# Patient Record
Sex: Male | Born: 1950 | Race: Black or African American | Hispanic: No
Health system: Southern US, Community
[De-identification: ages and names within clinical notes are randomized; demographics above are authoritative.]

## PROBLEM LIST (undated history)

## (undated) ENCOUNTER — Emergency Department (HOSPITAL_COMMUNITY): Admission: EM | Payer: No Typology Code available for payment source | Source: Home / Self Care

## (undated) DIAGNOSIS — I10 Essential (primary) hypertension: Secondary | ICD-10-CM

---

## 2017-06-18 ENCOUNTER — Other Ambulatory Visit: Payer: Self-pay

## 2017-06-18 ENCOUNTER — Emergency Department (HOSPITAL_COMMUNITY)
Admission: EM | Admit: 2017-06-18 | Discharge: 2017-06-18 | Disposition: A | Discharge status: Home / Self Care | Payer: Medicare Other | Attending: Emergency Medicine | Admitting: Emergency Medicine

## 2017-06-18 ENCOUNTER — Emergency Department (HOSPITAL_COMMUNITY): Payer: Medicare Other

## 2017-06-18 DIAGNOSIS — F10920 Alcohol use, unspecified with intoxication, uncomplicated: Secondary | ICD-10-CM | POA: Diagnosis not present

## 2017-06-18 DIAGNOSIS — Y999 Unspecified external cause status: Secondary | ICD-10-CM | POA: Insufficient documentation

## 2017-06-18 DIAGNOSIS — W19XXXA Unspecified fall, initial encounter: Secondary | ICD-10-CM

## 2017-06-18 DIAGNOSIS — Y9301 Activity, walking, marching and hiking: Secondary | ICD-10-CM | POA: Diagnosis not present

## 2017-06-18 DIAGNOSIS — Y929 Unspecified place or not applicable: Secondary | ICD-10-CM | POA: Insufficient documentation

## 2017-06-18 DIAGNOSIS — R55 Syncope and collapse: Secondary | ICD-10-CM | POA: Diagnosis not present

## 2017-06-18 DIAGNOSIS — S0990XA Unspecified injury of head, initial encounter: Secondary | ICD-10-CM | POA: Diagnosis not present

## 2017-06-18 DIAGNOSIS — W1830XA Fall on same level, unspecified, initial encounter: Secondary | ICD-10-CM | POA: Insufficient documentation

## 2017-06-18 LAB — CBC
HEMATOCRIT: 39.5 % (ref 39.0–52.0)
HEMOGLOBIN: 12.8 g/dL — AB (ref 13.0–17.0)
MCH: 30 pg (ref 26.0–34.0)
MCHC: 32.4 g/dL (ref 30.0–36.0)
MCV: 92.5 fL (ref 78.0–100.0)
Platelets: 267 10*3/uL (ref 150–400)
RBC: 4.27 MIL/uL (ref 4.22–5.81)
RDW: 13.4 % (ref 11.5–15.5)
WBC: 6.8 10*3/uL (ref 4.0–10.5)

## 2017-06-18 LAB — COMPREHENSIVE METABOLIC PANEL
ALK PHOS: 56 U/L (ref 38–126)
ALT: 16 U/L — ABNORMAL LOW (ref 17–63)
ANION GAP: 10 (ref 5–15)
AST: 18 U/L (ref 15–41)
Albumin: 4.1 g/dL (ref 3.5–5.0)
BILIRUBIN TOTAL: 1 mg/dL (ref 0.3–1.2)
BUN: 6 mg/dL (ref 6–20)
CALCIUM: 9.4 mg/dL (ref 8.9–10.3)
CO2: 23 mmol/L (ref 22–32)
Chloride: 107 mmol/L (ref 101–111)
Creatinine, Ser: 0.86 mg/dL (ref 0.61–1.24)
GFR calc non Af Amer: >60 mL/min (ref >60)
Glucose, Bld: 102 mg/dL — ABNORMAL HIGH (ref 65–99)
POTASSIUM: 3.3 mmol/L — AB (ref 3.5–5.1)
SODIUM: 140 mmol/L (ref 135–145)
TOTAL PROTEIN: 7.4 g/dL (ref 6.5–8.1)

## 2017-06-18 LAB — ETHANOL: Alcohol, Ethyl (B): 248 mg/dL — ABNORMAL HIGH (ref <10)

## 2017-06-18 NOTE — ED Notes (Signed)
Pt became hateful and disrespectful to staff. Provider notified and patient was escorted out of ED
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
## 2017-06-18 NOTE — ED Notes (Signed)
Patient transported to CT 

## 2017-06-18 NOTE — ED Notes (Signed)
Patient returned from CT

## 2017-06-18 NOTE — ED Notes (Signed)
Pt became hateful to staff in hallway. Provider notified and security called. Pt being escorted out of ED

## 2017-06-18 NOTE — ED Provider Notes (Signed)
[facility] EMERGENCY DEPARTMENT Provider Note   CSN: [id number] Arrival date & time: 06/18/17  9562     History   Chief Complaint Chief Complaint  Patient presents with  . Loss of Consciousness    HPI Tyler Oconnor is a 66 y.o. male.  Patient with history of alcoholism presents emergency department after reported fall.  Patient has been drinking alcohol heavily.  Patient may have hit his head.  Level 5 caveat due to intoxication.      No past medical history on file.  There are no active problems to display for this patient.   The histories are not reviewed yet. Please review them in the "History" navigator section and refresh this SmartLink.     Home Medications    Prior to Admission medications   Not on File    Family History No family history on file.  Social History Social History   Tobacco Use  . Smoking status: Not on file  Substance Use Topics  . Alcohol use: Not on file  . Drug use: Not on file     Allergies   Patient has no allergy information on record.   Review of Systems Review of Systems  Unable to perform ROS: Mental status change     Physical Exam Updated Vital Signs BP 133/63 (BP Location: Right Arm)   Pulse 69   Temp 97.7 F (36.5 C) (Oral)   Resp 17   Ht 5\' 6"  (1.676 m)   Wt 81.6 kg (180 lb)   SpO2 94%   BMI 29.05 kg/m   Physical Exam  Constitutional: He appears well-developed and well-nourished.  HENT:  Head: Normocephalic and atraumatic.  Mouth/Throat: Oropharynx is clear and moist.  No large contusions or depressions of the scalp or skull.  Eyes: Conjunctivae are normal. Right eye exhibits no discharge. Left eye exhibits no discharge.  Neck: Normal range of motion. Neck supple.  Cardiovascular: Normal rate, regular rhythm and normal heart sounds.  Pulmonary/Chest: Effort normal and breath sounds normal.  Abdominal: Soft. There is no tenderness. There is no rebound and no guarding.    Musculoskeletal:  No obvious extremity trauma.  Neurological: He is alert.  Patient answers questions but has slurred speech.  Skin: Skin is warm and dry.  Psychiatric: He has a normal mood and affect.  Nursing note and vitals reviewed.    ED Treatments / Results  Labs (all labs ordered are listed, but only abnormal results are displayed) Labs Reviewed  CBC - Abnormal; Notable for the following components:      Result Value   Hemoglobin 12.8 (*)    All other components within normal limits  COMPREHENSIVE METABOLIC PANEL - Abnormal; Notable for the following components:   Potassium 3.3 (*)    Glucose, Bld 102 (*)    ALT 16 (*)    All other components within normal limits  ETHANOL - Abnormal; Notable for the following components:   Alcohol, Ethyl (B) 248 (*)    All other components within normal limits    EKG  EKG Interpretation  Date/Time:  Monday June 18 2017 21:06:44 EST Ventricular Rate:  72 PR Interval:  166 QRS Duration: 80 QT Interval:  438 QTC Calculation: 479 R Axis:   70 Text Interpretation:  Normal sinus rhythm Possible Left atrial enlargement Nonspecific T wave abnormality Prolonged QT Abnormal ECG No old tracing to compare Confirmed by Melene Plan [phone]) on 06/18/2017 9:30:40 PM       Radiology  Ct Head Wo Contrast  Result Date: 06/18/2017 CLINICAL DATA:  ETOH, syncopal episode, possible head injury. EXAM: CT HEAD WITHOUT CONTRAST CT CERVICAL SPINE WITHOUT CONTRAST TECHNIQUE: Multidetector CT imaging of the head and cervical spine was performed following the standard protocol without intravenous contrast. Multiplanar CT image reconstructions of the cervical spine were also generated. COMPARISON:  None. FINDINGS: CT HEAD FINDINGS Brain: Generalized parenchymal atrophy with commensurate dilatation of the ventricles and sulci. Patchy areas of low density within the bilateral periventricular and subcortical white matter regions, most suggestive of chronic  small vessel ischemic change. There is no mass, hemorrhage, edema or other evidence of acute parenchymal abnormality. No extra-axial hemorrhage. Vascular: There are chronic calcified atherosclerotic changes of the large vessels at the skull base. No unexpected hyperdense vessel. Skull: Normal. Negative for fracture or focal lesion. Sinuses/Orbits: Periorbital and retro-orbital soft tissues are unremarkable. Visualized paranasal sinuses are clear. Slightly displaced fracture deformity of the left nasal bone, of uncertain age, most likely acute. Chronic appearing deformity of the left zygomatic arch. Additional chronic appearing deformities of the medial orbital walls bilaterally. Other: None. CT CERVICAL SPINE FINDINGS Alignment: Mild levoscoliosis. No evidence of acute vertebral body subluxation. Skull base and vertebrae: No fracture line or displaced fracture fragment identified. Facet joints appear intact and normally aligned throughout. Soft tissues and spinal canal: No prevertebral fluid or swelling. No visible canal hematoma. Disc levels: Mild disc desiccations within the lower cervical spine. No significant central canal stenosis at any level. Upper chest: No acute findings. Scarring/ fibrosis and emphysematous blebs at the lung apices. Other: Carotid atherosclerosis. IMPRESSION: 1. No acute intracranial abnormality. No intracranial hemorrhage or edema. Atrophy and chronic small vessel ischemic changes in the white matter. 2. Left nasal bone fracture, slightly displaced, of uncertain age but favored to be acute. Additional chronic appearing fractures/deformities within the left zygomatic arch and medial orbital walls bilaterally. 3. No fracture or acute subluxation within the cervical spine. Mild degenerative change in the lower cervical spine. Mild scoliosis. 4. Scarring/fibrosis and emphysematous blebs at the lung apices. 5. Carotid atherosclerosis. Electronically Signed   By: Bary RichardStan  Maynard M.D.   On:  06/18/2017 21:09   Ct Cervical Spine Wo Contrast  Result Date: 06/18/2017 CLINICAL DATA:  ETOH, syncopal episode, possible head injury. EXAM: CT HEAD WITHOUT CONTRAST CT CERVICAL SPINE WITHOUT CONTRAST TECHNIQUE: Multidetector CT imaging of the head and cervical spine was performed following the standard protocol without intravenous contrast. Multiplanar CT image reconstructions of the cervical spine were also generated. COMPARISON:  None. FINDINGS: CT HEAD FINDINGS Brain: Generalized parenchymal atrophy with commensurate dilatation of the ventricles and sulci. Patchy areas of low density within the bilateral periventricular and subcortical white matter regions, most suggestive of chronic small vessel ischemic change. There is no mass, hemorrhage, edema or other evidence of acute parenchymal abnormality. No extra-axial hemorrhage. Vascular: There are chronic calcified atherosclerotic changes of the large vessels at the skull base. No unexpected hyperdense vessel. Skull: Normal. Negative for fracture or focal lesion. Sinuses/Orbits: Periorbital and retro-orbital soft tissues are unremarkable. Visualized paranasal sinuses are clear. Slightly displaced fracture deformity of the left nasal bone, of uncertain age, most likely acute. Chronic appearing deformity of the left zygomatic arch. Additional chronic appearing deformities of the medial orbital walls bilaterally. Other: None. CT CERVICAL SPINE FINDINGS Alignment: Mild levoscoliosis. No evidence of acute vertebral body subluxation. Skull base and vertebrae: No fracture line or displaced fracture fragment identified. Facet joints appear intact and normally aligned throughout. Soft tissues and  spinal canal: No prevertebral fluid or swelling. No visible canal hematoma. Disc levels: Mild disc desiccations within the lower cervical spine. No significant central canal stenosis at any level. Upper chest: No acute findings. Scarring/ fibrosis and emphysematous blebs at  the lung apices. Other: Carotid atherosclerosis. IMPRESSION: 1. No acute intracranial abnormality. No intracranial hemorrhage or edema. Atrophy and chronic small vessel ischemic changes in the white matter. 2. Left nasal bone fracture, slightly displaced, of uncertain age but favored to be acute. Additional chronic appearing fractures/deformities within the left zygomatic arch and medial orbital walls bilaterally. 3. No fracture or acute subluxation within the cervical spine. Mild degenerative change in the lower cervical spine. Mild scoliosis. 4. Scarring/fibrosis and emphysematous blebs at the lung apices. 5. Carotid atherosclerosis. Electronically Signed   By: Bary RichardStan  Maynard M.D.   On: 06/18/2017 21:09    Procedures Procedures (including critical care time)  Medications Ordered in ED Medications - No data to display   Initial Impression / Assessment and Plan / ED Course  I have reviewed the triage vital signs and the nursing notes.  Pertinent labs & imaging results that were available during my care of the patient were reviewed by me and considered in my medical decision making (see chart for details).     Patient seen and examined. Pt clinically intoxicated. Will image.   Vital signs reviewed and are as follows: BP 133/63 (BP Location: Right Arm)   Pulse 69   Temp 97.7 F (36.5 C) (Oral)   Resp 17   Ht 5\' 6"  (1.676 m)   Wt 81.6 kg (180 lb)   SpO2 94%   BMI 29.05 kg/m   Pt discussed with and seen by Dr. Adela LankFloyd.  Imaging reassuring.  C-collar removed previously.  Patient allowed to rest for couple of hours in the emergency department.  He woke up and was verbally aggressive.  He was able to walk without difficulty and deemed clinically sober.  Patient left emergency department without discharge paperwork.  Final Clinical Impressions(s) / ED Diagnoses   Final diagnoses:  Alcoholic intoxication without complication (HCC)  Fall, initial encounter   Patient with alcohol  intoxication and fall.  Imaging reassuring without acute injury.  Patient clinically sober at time of discharge.  ED Discharge Orders    None       Renne CriglerGeiple, Ajit Errico, PA-C 06/18/17 [id number]    Melene PlanFloyd, Dan, DO 06/19/17 1501

## 2017-06-18 NOTE — ED Notes (Addendum)
Attempted to ambulate Pt. Pt able to stand with assistance though unable to take steps. Pt assisted back onto the bed. RN notified

## 2018-09-29 IMAGING — CT CT CERVICAL SPINE W/O CM
4 of 7 series · 14 of 33 positions shown, 15 images · non-contrast
Comparison: None.

CLINICAL DATA: ETOH, syncopal episode, possible head injury.

EXAM:
CT HEAD WITHOUT CONTRAST
CT CERVICAL SPINE WITHOUT CONTRAST
TECHNIQUE: Multidetector CT imaging of the head and cervical spine was
performed following the standard protocol without intravenous
contrast. Multiplanar CT image reconstructions of the cervical spine
were also generated.

[Series 9: c_spine 2.0 st · axial · 0.28mm/px · z∈[-258,-150]mm · 4 of 92 slices shown, 5 images]
[im 19/92  soft-tissue]
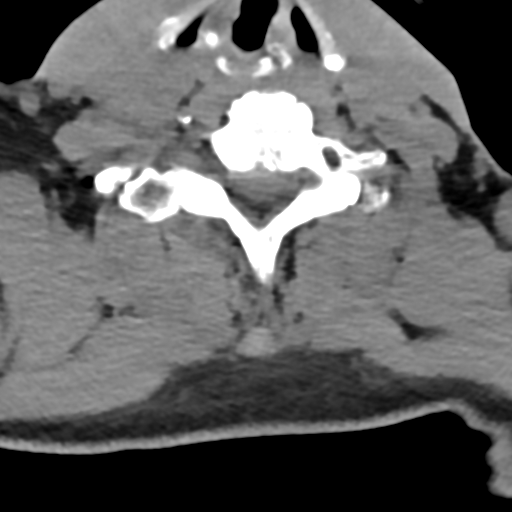
[im 19/92  bone]
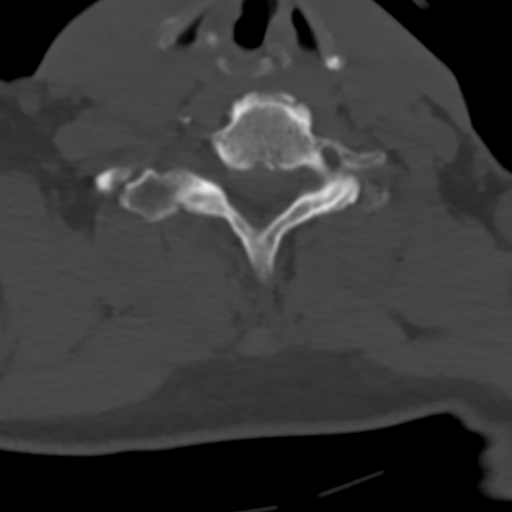
[im 37/92  bone]
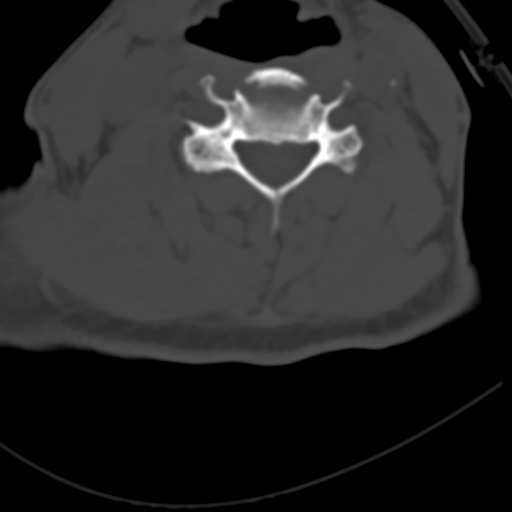
[im 55/92  bone]
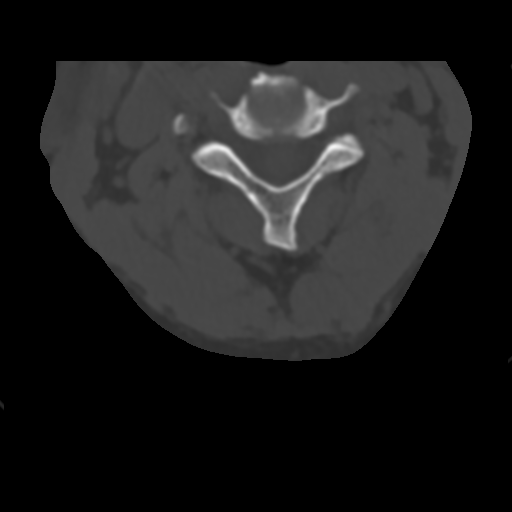
[im 73/92  bone]
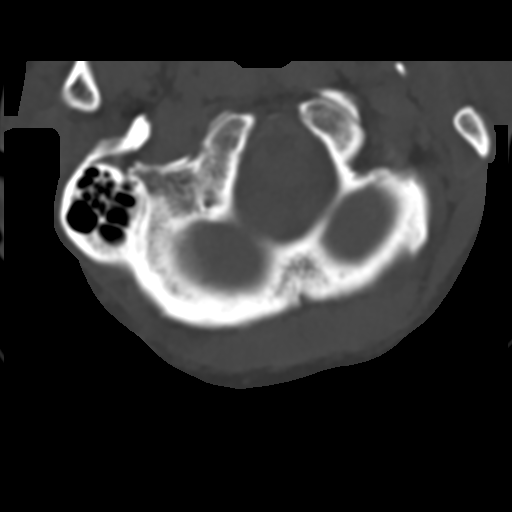

[Series 10: coronal bone · coronal · 0.23mm/px · 3 of 61 slices shown]
[im 16/61  bone]
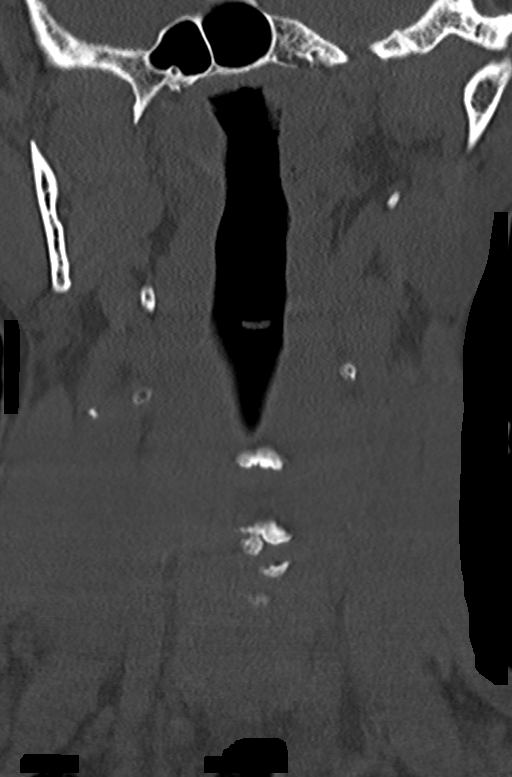
[im 31/61  bone]
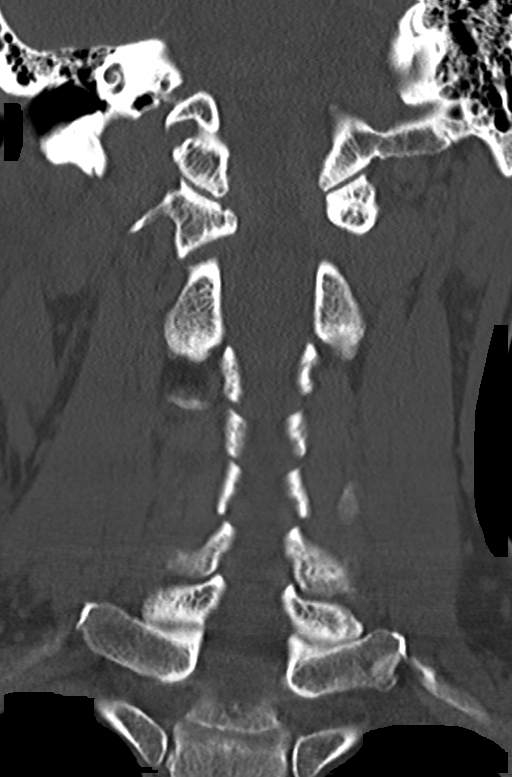
[im 46/61  bone]
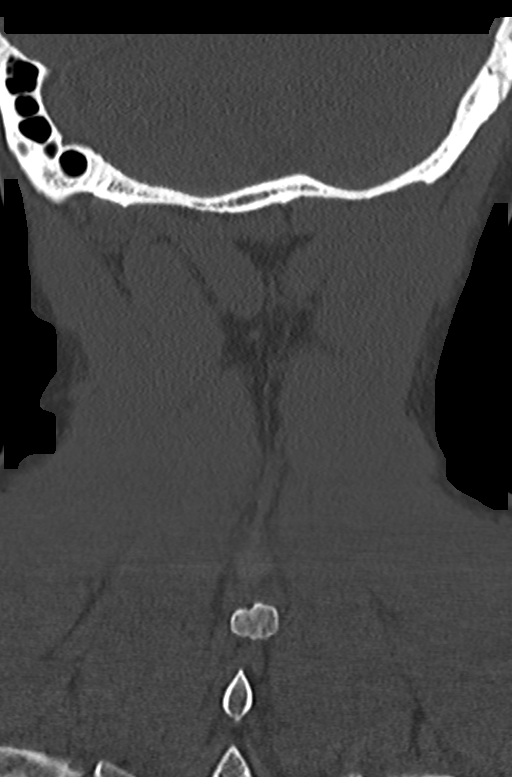

[Series 11: sagittal bone · sagittal · 0.24mm/px · 5 of 61 slices shown]
[im 11/61  bone]
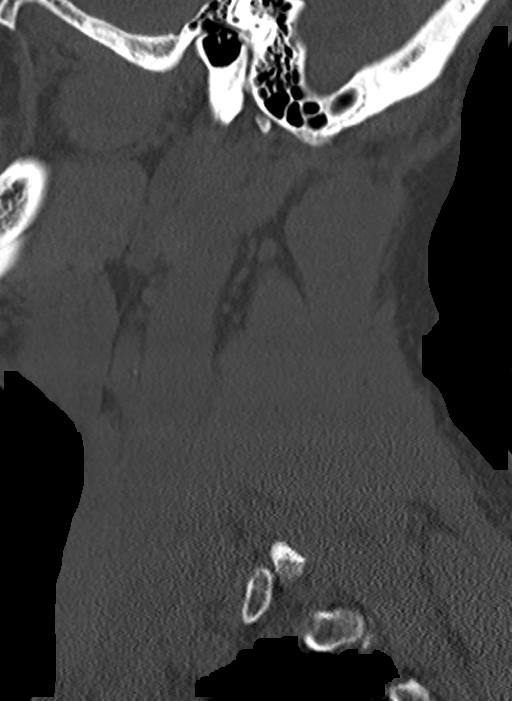
[im 21/61  bone]
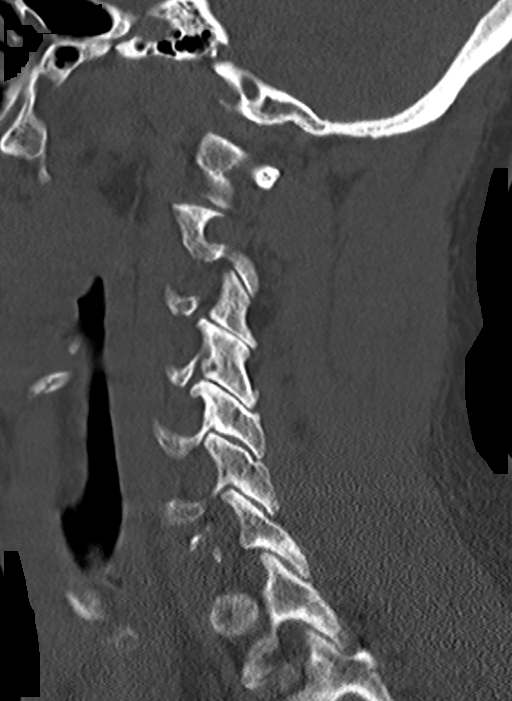
[im 31/61  bone]
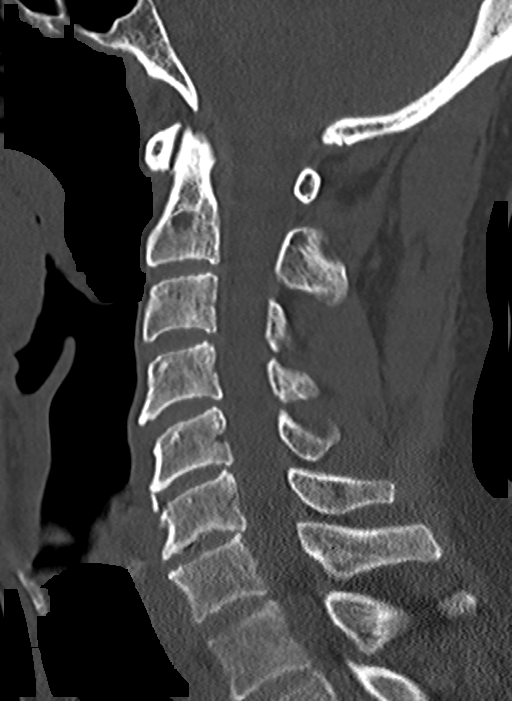
[im 41/61  bone]
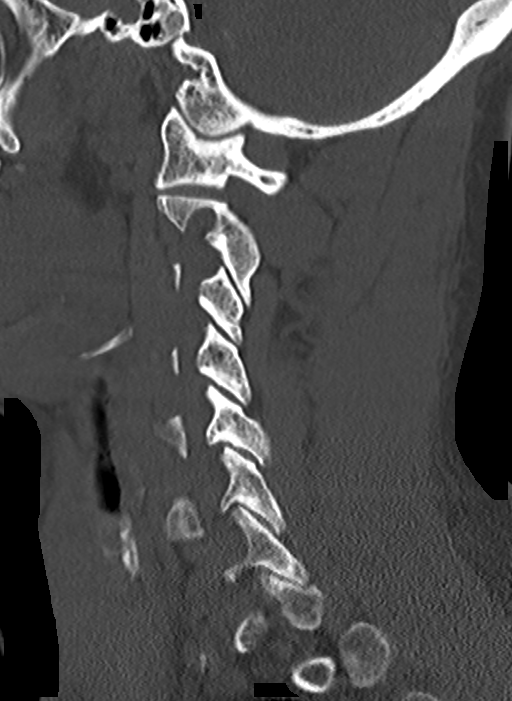
[im 51/61  bone]
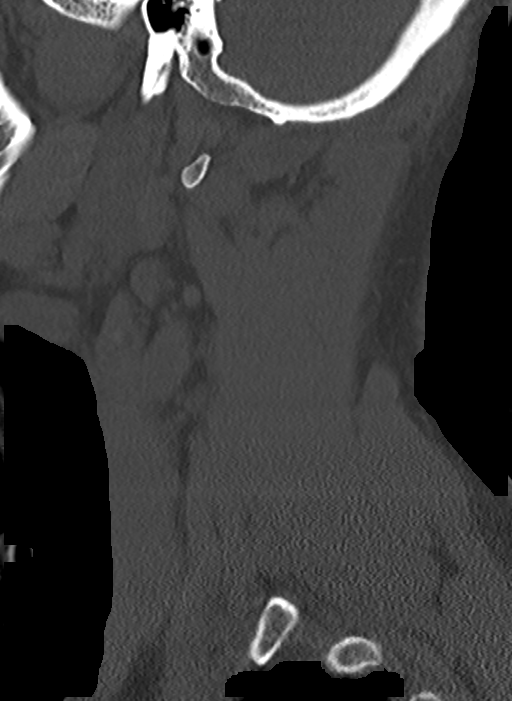

[Series 13: orthogonal axial st · axial · 0.21mm/px · z∈[-254,-220]mm · 2 of 81 slices shown]
[im 21/81  bone]
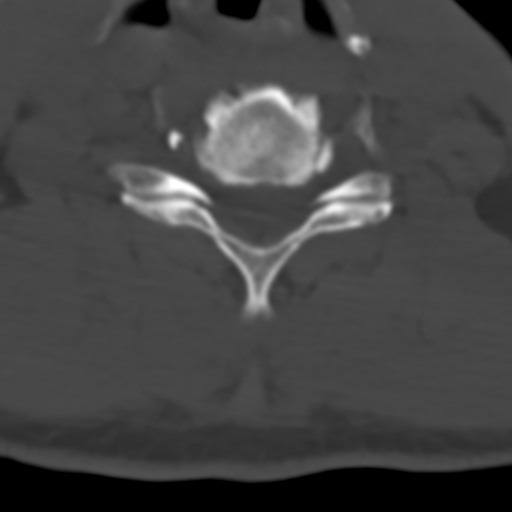
[im 41/81  bone]
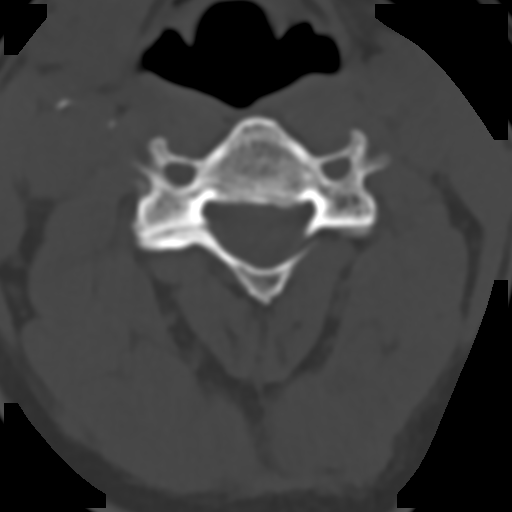

[14 of 33 positions shown; findings below may reference images not displayed]

FINDINGS: CT HEAD FINDINGS

Brain: Generalized parenchymal atrophy with commensurate dilatation
of the ventricles and sulci. Patchy areas of low density within the
bilateral periventricular and subcortical white matter regions, most
suggestive of chronic small vessel ischemic change.

There is no mass, hemorrhage, edema or other evidence of acute
parenchymal abnormality. No extra-axial hemorrhage.

Vascular: There are chronic calcified atherosclerotic changes of the
large vessels at the skull base. No unexpected hyperdense vessel.

Skull: Normal. Negative for fracture or focal lesion.

Sinuses/Orbits: Periorbital and retro-orbital soft tissues are
unremarkable. Visualized paranasal sinuses are clear.

Slightly displaced fracture deformity of the left nasal bone, of
uncertain age, most likely acute. Chronic appearing deformity of the
left zygomatic arch. Additional chronic appearing deformities of the
medial orbital walls bilaterally.

Other: None.

CT CERVICAL SPINE FINDINGS

Alignment: Mild levoscoliosis. No evidence of acute vertebral body
subluxation.

Skull base and vertebrae: No fracture line or displaced fracture
fragment identified. Facet joints appear intact and normally aligned
throughout.

Soft tissues and spinal canal: No prevertebral fluid or swelling. No
visible canal hematoma.

Disc levels: Mild disc desiccations within the lower cervical spine.
No significant central canal stenosis at any level.

Upper chest: No acute findings. Scarring/ fibrosis and emphysematous
blebs at the lung apices.

Other: Carotid atherosclerosis.
IMPRESSION: 1. No acute intracranial abnormality. No intracranial hemorrhage or
edema. Atrophy and chronic small vessel ischemic changes in the
white matter.
2. Left nasal bone fracture, slightly displaced, of uncertain age
but favored to be acute. Additional chronic appearing
fractures/deformities within the left zygomatic arch and medial
orbital walls bilaterally.
3. No fracture or acute subluxation within the cervical spine. Mild
degenerative change in the lower cervical spine. Mild scoliosis.
4. Scarring/fibrosis and emphysematous blebs at the lung apices.
5. Carotid atherosclerosis.

## 2022-02-04 ENCOUNTER — Encounter (HOSPITAL_COMMUNITY): Payer: Self-pay | Admitting: Emergency Medicine

## 2022-02-04 ENCOUNTER — Emergency Department (HOSPITAL_COMMUNITY): Payer: Medicare Other

## 2022-02-04 ENCOUNTER — Emergency Department (HOSPITAL_COMMUNITY)
Admission: EM | Admit: 2022-02-04 | Discharge: 2022-02-04 | Disposition: A | Discharge status: Home / Self Care | Payer: Medicare Other | Attending: Emergency Medicine | Admitting: Emergency Medicine

## 2022-02-04 DIAGNOSIS — S50312A Abrasion of left elbow, initial encounter: Secondary | ICD-10-CM | POA: Insufficient documentation

## 2022-02-04 DIAGNOSIS — S59902A Unspecified injury of left elbow, initial encounter: Secondary | ICD-10-CM | POA: Diagnosis present

## 2022-02-04 DIAGNOSIS — R519 Headache, unspecified: Secondary | ICD-10-CM | POA: Insufficient documentation

## 2022-02-04 DIAGNOSIS — Y92410 Unspecified street and highway as the place of occurrence of the external cause: Secondary | ICD-10-CM | POA: Diagnosis not present

## 2022-02-04 HISTORY — DX: Essential (primary) hypertension: I10

## 2022-02-04 MED ORDER — ACETAMINOPHEN 325 MG PO TABS
650.0000 mg | ORAL_TABLET | Freq: Once | ORAL | Status: DC
  Filled 2022-02-04: qty 2
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
